# Patient Record
Sex: Male | Born: 1960 | Race: Black or African American | Hispanic: No | Marital: Married | Smoking: Never smoker
Health system: Southern US, Community
[De-identification: ages and names within clinical notes are randomized; demographics above are authoritative.]

## PROBLEM LIST (undated history)

## (undated) DIAGNOSIS — G4730 Sleep apnea, unspecified: Secondary | ICD-10-CM

## (undated) DIAGNOSIS — E119 Type 2 diabetes mellitus without complications: Secondary | ICD-10-CM

## (undated) DIAGNOSIS — I10 Essential (primary) hypertension: Secondary | ICD-10-CM

---

## 2015-12-31 ENCOUNTER — Emergency Department (HOSPITAL_COMMUNITY)
Admission: EM | Admit: 2015-12-31 | Discharge: 2015-12-31 | Disposition: A | Discharge status: Home / Self Care | Payer: Worker's Compensation | Attending: Emergency Medicine | Admitting: Emergency Medicine

## 2015-12-31 ENCOUNTER — Encounter (HOSPITAL_COMMUNITY): Payer: Self-pay | Admitting: Emergency Medicine

## 2015-12-31 ENCOUNTER — Emergency Department (HOSPITAL_COMMUNITY): Payer: Worker's Compensation

## 2015-12-31 DIAGNOSIS — Y9289 Other specified places as the place of occurrence of the external cause: Secondary | ICD-10-CM | POA: Diagnosis not present

## 2015-12-31 DIAGNOSIS — S61219A Laceration without foreign body of unspecified finger without damage to nail, initial encounter: Secondary | ICD-10-CM

## 2015-12-31 DIAGNOSIS — I10 Essential (primary) hypertension: Secondary | ICD-10-CM | POA: Diagnosis not present

## 2015-12-31 DIAGNOSIS — Y9389 Activity, other specified: Secondary | ICD-10-CM | POA: Insufficient documentation

## 2015-12-31 DIAGNOSIS — S61212A Laceration without foreign body of right middle finger without damage to nail, initial encounter: Secondary | ICD-10-CM | POA: Diagnosis present

## 2015-12-31 DIAGNOSIS — W311XXA Contact with metalworking machines, initial encounter: Secondary | ICD-10-CM | POA: Insufficient documentation

## 2015-12-31 DIAGNOSIS — Y998 Other external cause status: Secondary | ICD-10-CM | POA: Insufficient documentation

## 2015-12-31 DIAGNOSIS — E119 Type 2 diabetes mellitus without complications: Secondary | ICD-10-CM | POA: Insufficient documentation

## 2015-12-31 DIAGNOSIS — Z8669 Personal history of other diseases of the nervous system and sense organs: Secondary | ICD-10-CM | POA: Diagnosis not present

## 2015-12-31 HISTORY — DX: Sleep apnea, unspecified: G47.30

## 2015-12-31 HISTORY — DX: Type 2 diabetes mellitus without complications: E11.9

## 2015-12-31 HISTORY — DX: Essential (primary) hypertension: I10

## 2015-12-31 MED ORDER — LIDOCAINE HCL 2 % IJ SOLN
10.0000 mL | Freq: Once | INTRAMUSCULAR | Status: AC
  Administered 2015-12-31: 200 mg via INTRADERMAL
  Filled 2015-12-31: qty 20

## 2015-12-31 MED ORDER — HYDROCODONE-ACETAMINOPHEN 5-325 MG PO TABS
1.0000 | ORAL_TABLET | Freq: Four times a day (QID) | ORAL | Status: AC | PRN
Start: 2015-12-31 — End: ?
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
## 2015-12-31 NOTE — Discharge Instructions (Signed)
Please have your sutures remove in 7 days.  Return if you notices sign of infection.  Take pain medication as needed but be aware that it can cause drowsiness.  Laceration Care, Adult A laceration is a cut that goes through all layers of the skin. The cut also goes into the tissue that is right under the skin. Some cuts heal on their own. Others need to be closed with stitches (sutures), staples, skin adhesive strips, or wound glue. Taking care of your cut lowers your risk of infection and helps your cut to heal better. HOW TO TAKE CARE OF YOUR CUT For stitches or staples:  Keep the wound clean and dry.  If you were given a bandage (dressing), you should change it at least one time per day or as told by your doctor. You should also change it if it gets wet or dirty.  Keep the wound completely dry for the first 24 hours or as told by your doctor. After that time, you may take a shower or a bath. However, make sure that the wound is not soaked in water until after the stitches or staples have been removed.  Clean the wound one time each day or as told by your doctor:  Wash the wound with soap and water.  Rinse the wound with water until all of the soap comes off.  Pat the wound dry with a clean towel. Do not rub the wound.  After you clean the wound, put a thin layer of antibiotic ointment on it as told by your doctor. This ointment:  Helps to prevent infection.  Keeps the bandage from sticking to the wound.  Have your stitches or staples removed as told by your doctor. If your doctor used skin adhesive strips:   Keep the wound clean and dry.  If you were given a bandage, you should change it at least one time per day or as told by your doctor. You should also change it if it gets dirty or wet.  Do not get the skin adhesive strips wet. You can take a shower or a bath, but be careful to keep the wound dry.  If the wound gets wet, pat it dry with a clean towel. Do not rub the  wound.  Skin adhesive strips fall off on their own. You can trim the strips as the wound heals. Do not remove any strips that are still stuck to the wound. They will fall off after a while. If your doctor used wound glue:  Try to keep your wound dry, but you may briefly wet it in the shower or bath. Do not soak the wound in water, such as by swimming.  After you take a shower or a bath, gently pat the wound dry with a clean towel. Do not rub the wound.  Do not do any activities that will make you really sweaty until the skin glue has fallen off on its own.  Do not apply liquid, cream, or ointment medicine to your wound while the skin glue is still on.  If you were given a bandage, you should change it at least one time per day or as told by your doctor. You should also change it if it gets dirty or wet.  If a bandage is placed over the wound, do not let the tape for the bandage touch the skin glue.  Do not pick at the glue. The skin glue usually stays on for 5-10 days. Then, it falls off  of the skin. General Instructions  To help prevent scarring, make sure to cover your wound with sunscreen whenever you are outside after stitches are removed, after adhesive strips are removed, or when wound glue stays in place and the wound is healed. Make sure to wear a sunscreen of at least 30 SPF.  Take over-the-counter and prescription medicines only as told by your doctor.  If you were given antibiotic medicine or ointment, take or apply it as told by your doctor. Do not stop using the antibiotic even if your wound is getting better.  Do not scratch or pick at the wound.  Keep all follow-up visits as told by your doctor. This is important.  Check your wound every day for signs of infection. Watch for:  Redness, swelling, or pain.  Fluid, blood, or pus.  Raise (elevate) the injured area above the level of your heart while you are sitting or lying down, if possible. GET HELP IF:  You got a  tetanus shot and you have any of these problems at the injection site:  Swelling.  Very bad pain.  Redness.  Bleeding.  You have a fever.  A wound that was closed breaks open.  You notice a bad smell coming from your wound or your bandage.  You notice something coming out of the wound, such as wood or glass.  Medicine does not help your pain.  You have more redness, swelling, or pain at the site of your wound.  You have fluid, blood, or pus coming from your wound.  You notice a change in the color of your skin near your wound.  You need to change the bandage often because fluid, blood, or pus is coming from the wound.  You start to have a new rash.  You start to have numbness around the wound. GET HELP RIGHT AWAY IF:  You have very bad swelling around the wound.  Your pain suddenly gets worse and is very bad.  You notice painful lumps near the wound or on skin that is anywhere on your body.  You have a red streak going away from your wound.  The wound is on your hand or foot and you cannot move a finger or toe like you usually can.  The wound is on your hand or foot and you notice that your fingers or toes look pale or bluish.   This information is not intended to replace advice given to you by your health care provider. Make sure you discuss any questions you have with your health care provider.   Document Released: 01/19/2008 Document Revised: 12/17/2014 Document Reviewed: 07/29/2014 Elsevier Interactive Patient Education [employer]

## 2015-12-31 NOTE — ED Notes (Signed)
Patient states cut middle finger of R hand approximately 30 min ago.  Patient states cut with a miter saw.   Bleeding under control at triage.
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
